# Patient Record
Sex: Male | Born: 1975 | Race: White | Hispanic: No | Marital: Single | Smoking: Never smoker
Health system: Southern US, Community
[De-identification: ages and names within clinical notes are randomized; demographics above are authoritative.]

## PROBLEM LIST (undated history)

## (undated) DIAGNOSIS — F419 Anxiety disorder, unspecified: Secondary | ICD-10-CM

## (undated) DIAGNOSIS — E785 Hyperlipidemia, unspecified: Secondary | ICD-10-CM

## (undated) DIAGNOSIS — F319 Bipolar disorder, unspecified: Secondary | ICD-10-CM

## (undated) DIAGNOSIS — I10 Essential (primary) hypertension: Secondary | ICD-10-CM

---

## 2002-08-31 ENCOUNTER — Inpatient Hospital Stay (HOSPITAL_COMMUNITY): Admission: EM | Admit: 2002-08-31 | Discharge: 2002-09-04 | Payer: Self-pay | Admitting: Emergency Medicine

## 2002-08-31 ENCOUNTER — Encounter: Payer: Self-pay | Admitting: General Surgery

## 2002-08-31 ENCOUNTER — Encounter: Payer: Self-pay | Admitting: Emergency Medicine

## 2002-08-31 ENCOUNTER — Encounter: Payer: Self-pay | Admitting: Orthopedic Surgery

## 2002-10-31 ENCOUNTER — Emergency Department (HOSPITAL_COMMUNITY): Admission: EM | Admit: 2002-10-31 | Discharge: 2002-11-01 | Payer: Self-pay | Admitting: Emergency Medicine

## 2002-11-22 ENCOUNTER — Encounter: Payer: Self-pay | Admitting: Neurosurgery

## 2002-11-22 ENCOUNTER — Encounter: Admission: RE | Admit: 2002-11-22 | Discharge: 2002-11-22 | Payer: Self-pay | Admitting: Neurosurgery

## 2003-01-24 ENCOUNTER — Encounter: Admission: RE | Admit: 2003-01-24 | Discharge: 2003-01-24 | Payer: Self-pay | Admitting: Neurosurgery

## 2003-09-26 ENCOUNTER — Emergency Department (HOSPITAL_COMMUNITY): Admission: EM | Admit: 2003-09-26 | Discharge: 2003-09-26 | Payer: Self-pay | Admitting: Family Medicine

## 2003-10-24 ENCOUNTER — Emergency Department (HOSPITAL_COMMUNITY): Admission: EM | Admit: 2003-10-24 | Discharge: 2003-10-25 | Payer: Self-pay | Admitting: Emergency Medicine

## 2003-10-31 ENCOUNTER — Inpatient Hospital Stay (HOSPITAL_COMMUNITY): Admission: EM | Admit: 2003-10-31 | Discharge: 2003-11-05 | Payer: Self-pay | Admitting: Psychiatry

## 2003-11-12 ENCOUNTER — Emergency Department (HOSPITAL_COMMUNITY): Admission: EM | Admit: 2003-11-12 | Discharge: 2003-11-13 | Payer: Self-pay | Admitting: Emergency Medicine

## 2003-12-12 ENCOUNTER — Inpatient Hospital Stay (HOSPITAL_COMMUNITY): Admission: AD | Admit: 2003-12-12 | Discharge: 2003-12-18 | Payer: Self-pay | Admitting: Psychiatry

## 2003-12-12 ENCOUNTER — Ambulatory Visit: Payer: Self-pay | Admitting: Psychiatry

## 2003-12-23 ENCOUNTER — Emergency Department (HOSPITAL_COMMUNITY): Admission: EM | Admit: 2003-12-23 | Discharge: 2003-12-23 | Payer: Self-pay | Admitting: Emergency Medicine

## 2003-12-24 ENCOUNTER — Inpatient Hospital Stay (HOSPITAL_COMMUNITY): Admission: EM | Admit: 2003-12-24 | Discharge: 2003-12-27 | Payer: Self-pay | Admitting: Psychiatry

## 2004-02-21 ENCOUNTER — Emergency Department: Payer: Self-pay | Admitting: Emergency Medicine

## 2018-10-19 ENCOUNTER — Emergency Department
Admission: EM | Admit: 2018-10-19 | Discharge: 2018-10-19 | Disposition: A | Discharge status: Home / Self Care | Payer: Medicaid Other | Attending: Emergency Medicine | Admitting: Emergency Medicine

## 2018-10-19 ENCOUNTER — Other Ambulatory Visit: Payer: Self-pay

## 2018-10-19 ENCOUNTER — Emergency Department: Payer: Medicaid Other

## 2018-10-19 ENCOUNTER — Encounter: Payer: Self-pay | Admitting: Intensive Care

## 2018-10-19 DIAGNOSIS — R079 Chest pain, unspecified: Secondary | ICD-10-CM | POA: Insufficient documentation

## 2018-10-19 DIAGNOSIS — Z5321 Procedure and treatment not carried out due to patient leaving prior to being seen by health care provider: Secondary | ICD-10-CM | POA: Insufficient documentation

## 2018-10-19 DIAGNOSIS — M79602 Pain in left arm: Secondary | ICD-10-CM | POA: Insufficient documentation

## 2018-10-19 DIAGNOSIS — R42 Dizziness and giddiness: Secondary | ICD-10-CM | POA: Insufficient documentation

## 2018-10-19 HISTORY — DX: Essential (primary) hypertension: I10

## 2018-10-19 HISTORY — DX: Hyperlipidemia, unspecified: E78.5

## 2018-10-19 HISTORY — DX: Anxiety disorder, unspecified: F41.9

## 2018-10-19 HISTORY — DX: Bipolar disorder, unspecified: F31.9

## 2018-10-19 LAB — BASIC METABOLIC PANEL
Anion gap: 9 (ref 5–15)
BUN: 12 mg/dL (ref 6–20)
CO2: 26 mmol/L (ref 22–32)
Calcium: 8.8 mg/dL — ABNORMAL LOW (ref 8.9–10.3)
Chloride: 104 mmol/L (ref 98–111)
Creatinine, Ser: 1.01 mg/dL (ref 0.61–1.24)
GFR calc Af Amer: >60 mL/min (ref >60)
GFR calc non Af Amer: >60 mL/min (ref >60)
Glucose, Bld: 129 mg/dL — ABNORMAL HIGH (ref 70–99)
Potassium: 3.6 mmol/L (ref 3.5–5.1)
Sodium: 139 mmol/L (ref 135–145)

## 2018-10-19 LAB — CBC
HCT: 43.3 % (ref 39.0–52.0)
Hemoglobin: 15.4 g/dL (ref 13.0–17.0)
MCH: 29.8 pg (ref 26.0–34.0)
MCHC: 35.6 g/dL (ref 30.0–36.0)
MCV: 83.9 fL (ref 80.0–100.0)
Platelets: 229 10*3/uL (ref 150–400)
RBC: 5.16 MIL/uL (ref 4.22–5.81)
RDW: 13.1 % (ref 11.5–15.5)
WBC: 8.8 10*3/uL (ref 4.0–10.5)
nRBC: 0 % (ref 0.0–0.2)

## 2018-10-19 LAB — TROPONIN I (HIGH SENSITIVITY): Troponin I (High Sensitivity): <2 ng/L (ref <18)

## 2018-10-19 NOTE — ED Notes (Signed)
No answer when called several times from lobby 

## 2018-10-19 NOTE — ED Triage Notes (Signed)
Patient c/o sharp central chest pain with radiation to left arm since around 0900 today. Also c/o dizziness. Reports he is from [facility] and was seen for the same this past Saturday.

## 2018-10-20 ENCOUNTER — Telehealth: Payer: Self-pay | Admitting: Emergency Medicine

## 2018-10-20 NOTE — Telephone Encounter (Signed)
Called patient due to lwot to inquire about condition and follow up plans.  No answer and no voicemail  

## 2020-05-26 IMAGING — CR CHEST - 2 VIEW
2 series · 2 of 2 positions shown · non-contrast
Comparison: 10/24/2003

CLINICAL DATA: Sharp central chest pain radiating to LEFT arm and
dizziness since this morning

EXAM:
CHEST - 2 VIEW

[chest pa]
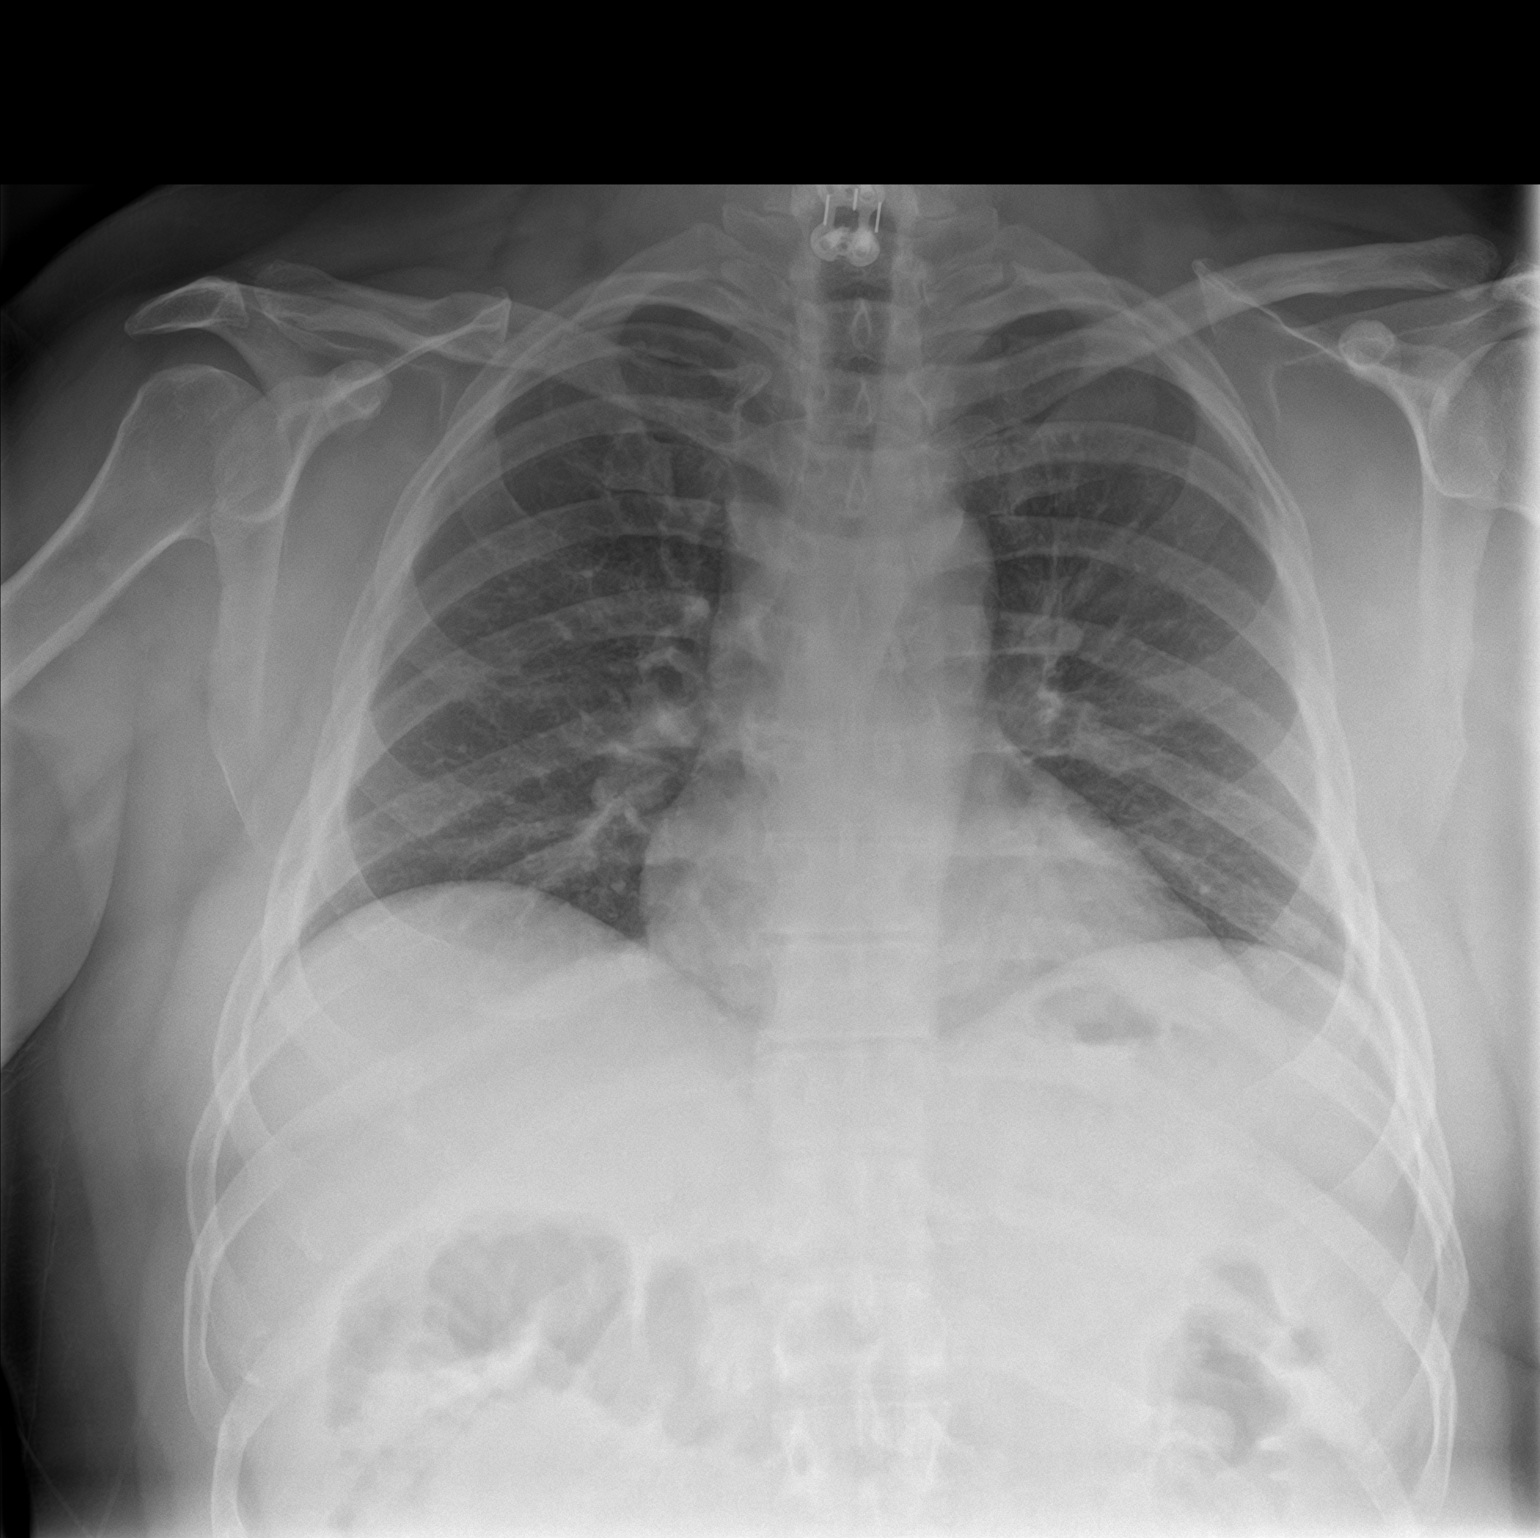

[chest lat]
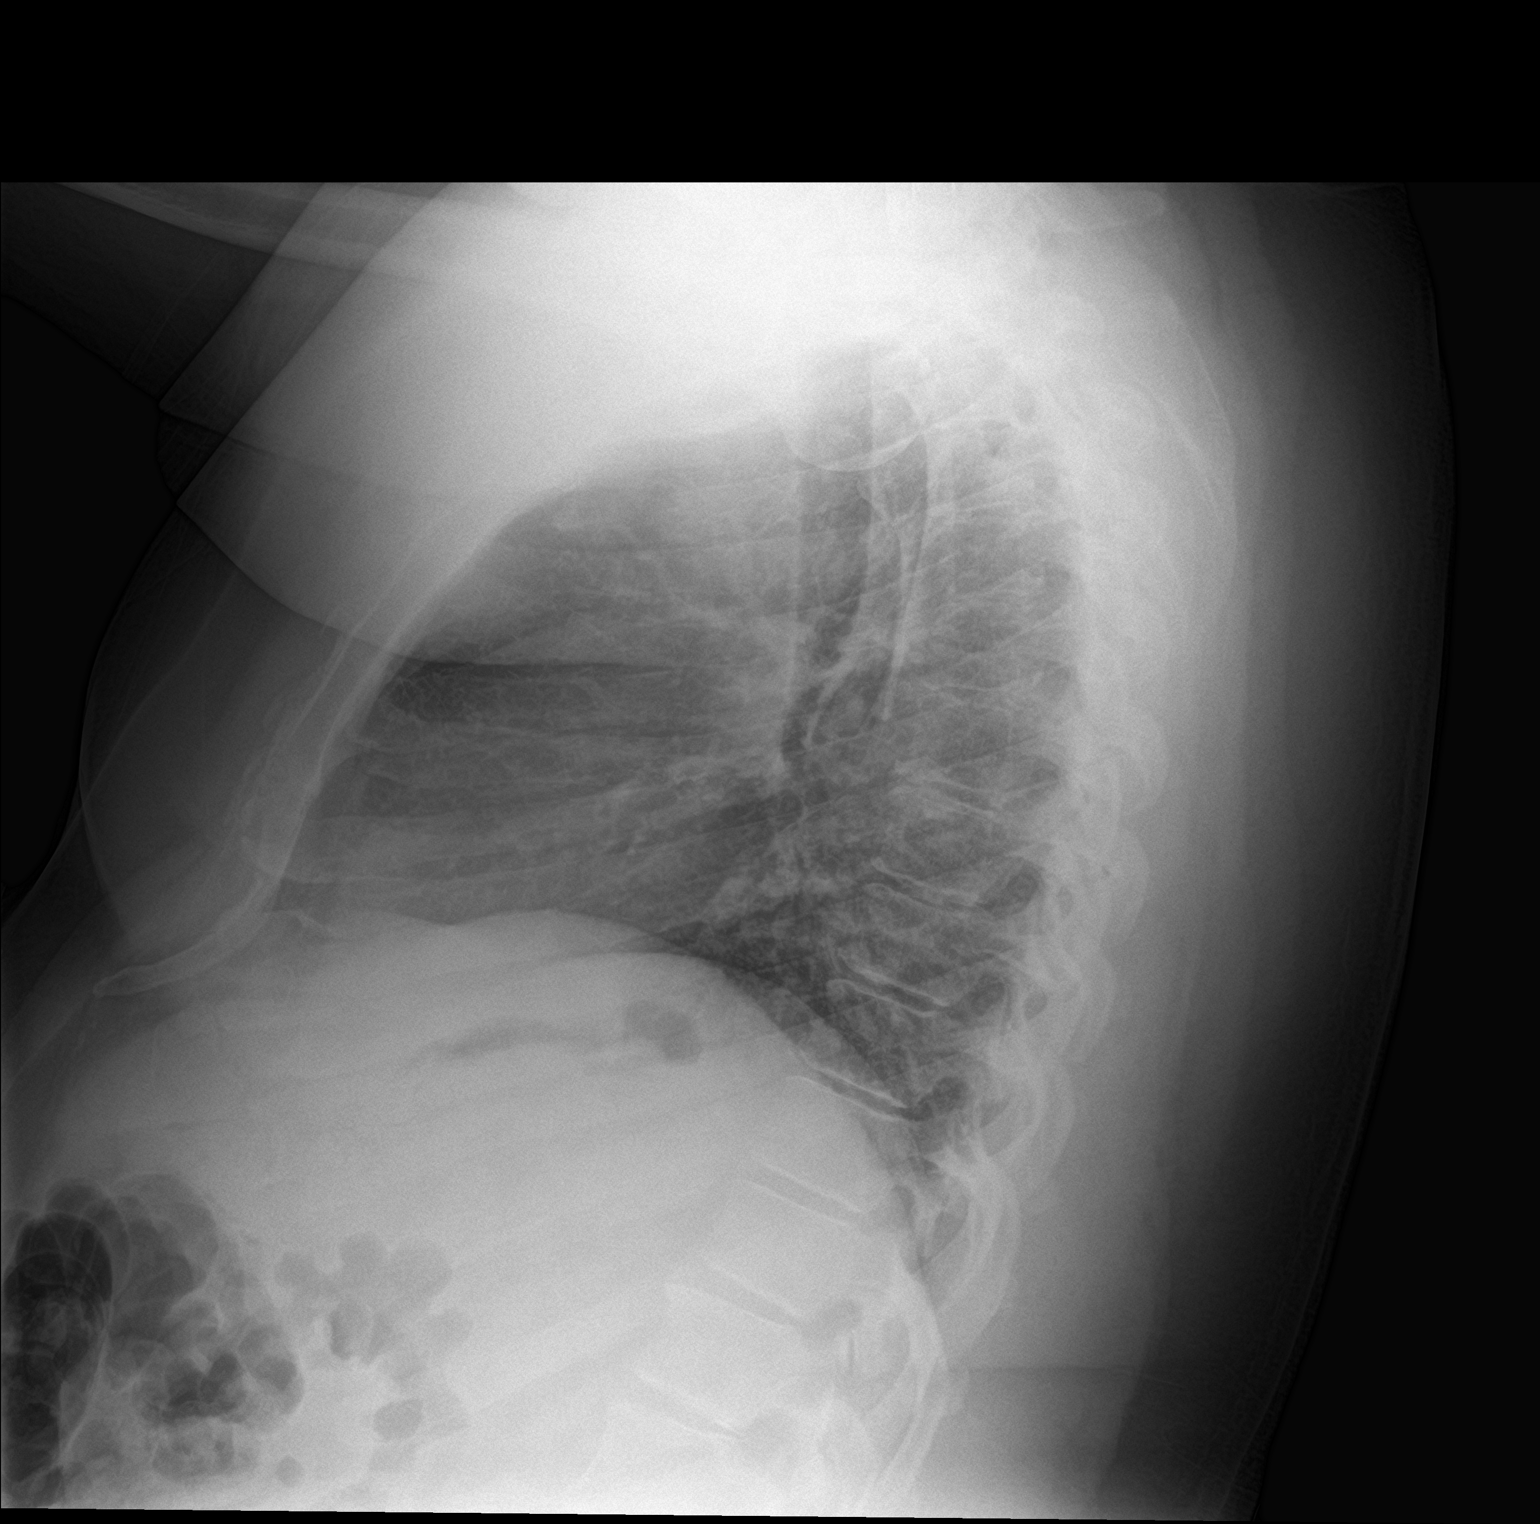

[2 of 2 positions shown; findings below may reference images not displayed]

FINDINGS: Upper normal heart size.

Mediastinal contours and pulmonary vascularity normal.

Lungs clear.

No infiltrate, pleural effusion or pneumothorax.

Interval cervical spine fusion.

No acute osseous findings.
IMPRESSION: No acute abnormalities.
